# Patient Record
Sex: Female | Born: 1955 | Race: White | Hispanic: No | Marital: Married | Smoking: Never smoker
Health system: Southern US, Community
[De-identification: ages and names within clinical notes are randomized; demographics above are authoritative.]

## PROBLEM LIST (undated history)

## (undated) DIAGNOSIS — K219 Gastro-esophageal reflux disease without esophagitis: Secondary | ICD-10-CM

## (undated) HISTORY — PX: BREAST LUMPECTOMY: SHX2

## (undated) HISTORY — PX: OTHER SURGICAL HISTORY: SHX169

---

## 2021-10-12 ENCOUNTER — Emergency Department (HOSPITAL_COMMUNITY)
Admission: EM | Admit: 2021-10-12 | Discharge: 2021-10-12 | Disposition: A | Discharge status: Home / Self Care | Payer: No Typology Code available for payment source | Attending: Emergency Medicine | Admitting: Emergency Medicine

## 2021-10-12 ENCOUNTER — Encounter (HOSPITAL_COMMUNITY): Payer: Self-pay | Admitting: *Deleted

## 2021-10-12 DIAGNOSIS — W268XXA Contact with other sharp object(s), not elsewhere classified, initial encounter: Secondary | ICD-10-CM | POA: Insufficient documentation

## 2021-10-12 DIAGNOSIS — S60410A Abrasion of right index finger, initial encounter: Secondary | ICD-10-CM | POA: Diagnosis present

## 2021-10-12 DIAGNOSIS — Z7721 Contact with and (suspected) exposure to potentially hazardous body fluids: Secondary | ICD-10-CM | POA: Insufficient documentation

## 2021-10-12 HISTORY — DX: Gastro-esophageal reflux disease without esophagitis: K21.9

## 2021-10-12 LAB — RAPID HIV SCREEN (HIV 1/2 AB+AG)
HIV 1/2 Antibodies: NONREACTIVE
HIV-1 P24 Antigen - HIV24: NONREACTIVE

## 2021-10-12 MED ORDER — ELVITEG-COBIC-EMTRICIT-TENOFAF 150-150-200-10 MG PREPACK
1.0000 | ORAL_TABLET | Freq: Once | ORAL | Status: AC
  Administered 2021-10-12: 1 via ORAL
  Filled 2021-10-12: qty 1

## 2021-10-12 MED ORDER — ELVITEG-COBIC-EMTRICIT-TENOFAF 150-150-200-10 MG PO TABS: 1.0000 | ORAL_TABLET | Freq: Every day | ORAL | 0 refills | Status: AC

## 2021-10-12 MED ORDER — ONDANSETRON 4 MG PO TBDP: 4.0000 mg | ORAL_TABLET | Freq: Three times a day (TID) | ORAL | 0 refills | Status: AC | PRN

## 2021-10-12 NOTE — ED Provider Notes (Addendum)
[facility] EMERGENCY DEPARTMENT Provider Note   CSN: [id number] Arrival date & time: 10/12/21  1552     History Chief Complaint  Patient presents with   Body Fluid Exposure    Krystal Johnson is a 65 y.o. female.  The history is provided by the patient.  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  Pt reports she had taken her gloves off and source had bleed on an area she was working in.  Pt was sent here for evaltuion. She has had hepatitis vaccines.  She does want HIV medication. Pt does not know sources hiv or hepatitis status.  Source is in custody at jail     Past Medical History:  Diagnosis Date   GERD (gastroesophageal reflux disease)     There are no problems to display for this patient.      OB History   No obstetric history on file.     No family history on file.     Home Medications Prior to Admission medications   Medication Sig Start Date End Date Taking? Authorizing Provider  elvitegravir-cobicistat-emtricitabine-tenofovir (GENVOYA) 150-150-200-10 MG TABS tablet Take 1 tablet by mouth daily with breakfast. 10/12/21  Yes Cheron Schaumann K, PA-C  ondansetron (ZOFRAN-ODT) 4 MG disintegrating tablet Take 1 tablet (4 mg total) by mouth every 8 (eight) hours as needed for nausea or vomiting. 10/12/21  Yes [name], PA-C    Allergies    Morphine and related and Rabies vaccines  Review of Systems   Review of Systems  Skin:  Positive for wound.  All other systems reviewed and are negative.  Physical Exam Updated Vital Signs BP 119/63 (BP Location: Right Arm)   Pulse 80   Temp 98.2 F (36.8 C) (Oral)   Resp 15   Ht 5\' 4"  (1.626 m)   Wt 95.3 kg   SpO2 98%   BMI 36.05 kg/m   Physical Exam Vitals reviewed.  Constitutional:      Appearance:  Normal appearance.  HENT:     Head: Normocephalic.     Mouth/Throat:     Mouth: Mucous membranes are moist.  Cardiovascular:     Rate and Rhythm: Normal rate.  Pulmonary:     Effort: Pulmonary effort is normal.  Musculoskeletal:        General: No swelling.     Comments: Superficial abrasion right index finger,   Skin:    General: Skin is warm.  Neurological:     General: No focal deficit present.     Mental Status: She is alert.  Psychiatric:        Mood and Affect: Mood normal.    ED Results / Procedures / Treatments   Labs (all labs ordered are listed, but only abnormal results are displayed) Labs Reviewed  RAPID HIV SCREEN (HIV 1/2 AB+AG)  HEPATITIS PANEL, ACUTE    EKG None  Radiology No results found.  Procedures Procedures   Medications Ordered in ED Medications  elvitegravir-cobicistat-emtricitabine-tenofovir (GENVOYA) 150-150-200-10 Prepack 1 each (has no administration in time range)    ED Course  I have reviewed the triage vital signs and the nursing notes.  Pertinent labs & imaging results that were available during my care of the patient were reviewed by me and considered in my  medical decision making (see chart for details).    MDM Rules/Calculators/A&P                           MDM:  Pt cleaned area well.  Hepatitis and HIV baseline ordered.  Pt given prep drugs.  Pt advised source of blood needs testing as well  Final Clinical Impression(s) / ED Diagnoses Final diagnoses:  Exposure to blood or body fluid    Rx / DC Orders ED Discharge Orders          Ordered    elvitegravir-cobicistat-emtricitabine-tenofovir (GENVOYA) 150-150-200-10 MG TABS tablet  Daily with breakfast        10/12/21 1834    ondansetron (ZOFRAN-ODT) 4 MG disintegrating tablet  Every 8 hours PRN        10/12/21 1835             [name], PA-C 10/12/21 1852    Osie Cheeks 10/12/21 1852    Terrilee Files, MD 10/13/21 1106

## 2021-10-12 NOTE — Discharge Instructions (Signed)
The person who's blood you were exposed to needs to have Hiv and hepatitis testing.

## 2021-10-12 NOTE — ED Triage Notes (Signed)
Blood borne exposure at work yesterday

## 2021-10-12 NOTE — ED Notes (Signed)
Dc instructions and scripts reviewed with pt. No questions or concerns at this time.  

## 2021-10-13 LAB — HEPATITIS PANEL, ACUTE
HCV Ab: NONREACTIVE
Hep A IgM: NONREACTIVE
Hep B C IgM: NONREACTIVE
Hepatitis B Surface Ag: NONREACTIVE

## 2021-12-21 ENCOUNTER — Emergency Department (HOSPITAL_COMMUNITY): Payer: No Typology Code available for payment source

## 2021-12-21 ENCOUNTER — Emergency Department (HOSPITAL_COMMUNITY)
Admission: EM | Admit: 2021-12-21 | Discharge: 2021-12-21 | Disposition: A | Discharge status: Home / Self Care | Payer: No Typology Code available for payment source | Attending: Emergency Medicine | Admitting: Emergency Medicine

## 2021-12-21 ENCOUNTER — Other Ambulatory Visit: Payer: Self-pay

## 2021-12-21 ENCOUNTER — Encounter (HOSPITAL_COMMUNITY): Payer: Self-pay | Admitting: *Deleted

## 2021-12-21 DIAGNOSIS — S299XXA Unspecified injury of thorax, initial encounter: Secondary | ICD-10-CM | POA: Diagnosis present

## 2021-12-21 DIAGNOSIS — Y9301 Activity, walking, marching and hiking: Secondary | ICD-10-CM | POA: Diagnosis not present

## 2021-12-21 DIAGNOSIS — W010XXA Fall on same level from slipping, tripping and stumbling without subsequent striking against object, initial encounter: Secondary | ICD-10-CM | POA: Insufficient documentation

## 2021-12-21 DIAGNOSIS — S2232XA Fracture of one rib, left side, initial encounter for closed fracture: Secondary | ICD-10-CM

## 2021-12-21 MED ORDER — HYDROCODONE-ACETAMINOPHEN 5-325 MG PO TABS
1.0000 | ORAL_TABLET | Freq: Once | ORAL | Status: AC
  Administered 2021-12-21: 1 via ORAL
  Filled 2021-12-21: qty 1

## 2021-12-21 MED ORDER — HYDROCODONE-ACETAMINOPHEN 5-325 MG PO TABS: 1.0000 | ORAL_TABLET | Freq: Four times a day (QID) | ORAL | 0 refills | Status: DC | PRN

## 2021-12-21 MED ORDER — ONDANSETRON HCL 4 MG PO TABS: 4.0000 mg | ORAL_TABLET | Freq: Three times a day (TID) | ORAL | 0 refills | Status: AC | PRN

## 2021-12-21 MED ORDER — ONDANSETRON 4 MG PO TBDP
4.0000 mg | ORAL_TABLET | Freq: Once | ORAL | Status: AC
Start: 2021-12-21 — End: 2021-12-21
  Administered 2021-12-21: 4 mg via ORAL
  Filled 2021-12-21: qty 1

## 2021-12-21 NOTE — Discharge Instructions (Signed)
You may take the medications prescribed for pain relief.  Do not drive within 4 hours of taking hydrocodone as this medication will make you drowsy.  Use the incentive spirometer as discussed to help prevent pneumonia which is a possible complication from rib fractures.  Activity as tolerated.

## 2021-12-21 NOTE — ED Provider Notes (Signed)
[facility] EMERGENCY DEPARTMENT Provider Note   CSN: [id number] Arrival date & time: 12/21/21  1545     History  Chief Complaint  Patient presents with   Marletta Lor    Krystal Johnson is a 66 y.o. female ending for evaluation of injury sustained in a fall.  She was walking into her place of employment this morning when she tripped on the edge of a sidewalk and fell landing directly with her mid chest region against the edge of the sidewalk.  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  She reports localized pain at her sternum which radiates into the left lower lateral rib cage area.  She completed her workday but has had significant discomfort since this fall.  She denies hitting her head or her face.  And denies any other complaints of pain.  She denies shortness of breath but does state taking a deep breath worsens her localized pain.  She has had no medications for her symptoms prior to arrival.  The history is provided by the patient.      Home Medications Prior to Admission medications   Medication Sig Start Date End Date Taking? Authorizing Provider  HYDROcodone-acetaminophen (NORCO/VICODIN) 5-325 MG tablet Take 1 tablet by mouth every 6 (six) hours as needed. 12/21/21  Yes Melodee Lupe, Raynelle Fanning, PA-C  ondansetron (ZOFRAN) 4 MG tablet Take 1 tablet (4 mg total) by mouth every 8 (eight) hours as needed for nausea or vomiting. 12/21/21  Yes Corrine Tillis, Raynelle Fanning, PA-C  elvitegravir-cobicistat-emtricitabine-tenofovir (GENVOYA) 150-150-200-10 MG TABS tablet Take 1 tablet by mouth daily with breakfast. 10/12/21   [name], PA-C  ondansetron (ZOFRAN-ODT) 4 MG disintegrating tablet Take 1 tablet (4 mg total) by mouth every 8 (eight) hours as needed for nausea or vomiting. 10/12/21   [name], PA-C      Allergies    Morphine and related and Rabies vaccines    Review of Systems   Review of Systems  Constitutional:  Negative for fever.  HENT:  Negative for congestion.   Eyes:  Negative.   Respiratory:  Negative for chest tightness and shortness of breath.   Cardiovascular:  Positive for chest pain.  Gastrointestinal:  Negative for abdominal pain, nausea and vomiting.  Genitourinary: Negative.   Musculoskeletal:  Negative for arthralgias, back pain, joint swelling and neck pain.  Skin: Negative.  Negative for rash and wound.  Neurological:  Negative for dizziness, weakness, light-headedness, numbness and headaches.  Psychiatric/Behavioral: Negative.    All other systems reviewed and are negative.  Physical Exam Updated Vital Signs BP (!) 123/56 (BP Location: Right Arm)    Pulse 69    Temp 98.2 F (36.8 C) (Oral)    Resp 16    Ht 5\' 4"  (1.626 m)    Wt 95.3 kg    SpO2 100%    BMI 36.05 kg/m  Physical Exam Vitals and nursing note reviewed.  Constitutional:      Appearance: She is well-developed.  HENT:     Head: Normocephalic and atraumatic.  Eyes:     Conjunctiva/sclera: Conjunctivae normal.  Cardiovascular:     Rate and Rhythm: Normal rate and regular rhythm.     Heart sounds: Normal heart sounds.  Pulmonary:     Effort: Pulmonary effort is normal.     Breath sounds: Normal breath sounds. No wheezing.  Chest:     Chest wall: Tenderness present. No deformity or swelling.       Comments: Tender to palpation  lower sternal border, not tender at her xiphoid.  Also tenderness at her left lateral ribs at her mid axillary line.  There is no palpable deformity, no crepitus. Abdominal:     General: Bowel sounds are normal.     Palpations: Abdomen is soft.     Tenderness: There is no abdominal tenderness.  Musculoskeletal:        General: Normal range of motion.     Cervical back: Normal and normal range of motion.     Thoracic back: Normal.     Lumbar back: Normal.  Skin:    General: Skin is warm and dry.  Neurological:     Mental Status: She is alert.    ED Results / Procedures / Treatments   Labs (all labs ordered are listed, but only abnormal  results are displayed) Labs Reviewed - No data to display  EKG None  Radiology DG Ribs Unilateral W/Chest Left  Result Date: 12/21/2021 CLINICAL DATA:  Status post fall.  Left rib pain. EXAM: LEFT RIBS AND CHEST - 3+ VIEW COMPARISON:  None. FINDINGS: Cardiac silhouette and mediastinal contours are within normal limits. Lungs are clear. No pleural effusion or pneumothorax. Mild dextrocurvature of the thoracolumbar junction. On frontal view of the left ribs, there appears to be minimal cortical step-off and linear lucency indicating a nondisplaced acute fracture of the lateral aspect of the left eleventh rib. IMPRESSION: Acute nondisplaced fracture of the lateral aspect of the left eleventh rib. No pneumothorax. Electronically Signed   By: Neita Garnet M.D.   On: 12/21/2021 16:57   DG Sternum  Result Date: 12/21/2021 CLINICAL DATA:  Fall.  Sternal chest pain. EXAM: STERNUM - 1 VIEW COMPARISON:  None. FINDINGS: Lateral view of the sternum shows no evidence of fracture or other focal bone lesions. IMPRESSION: Negative. Electronically Signed   By: Danae Orleans M.D.   On: 12/21/2021 18:56    Procedures Procedures    Medications Ordered in ED Medications  HYDROcodone-acetaminophen (NORCO/VICODIN) 5-325 MG per tablet 1 tablet (1 tablet Oral Given 12/21/21 1842)  ondansetron (ZOFRAN-ODT) disintegrating tablet 4 mg (4 mg Oral Given 12/21/21 1843)    ED Course/ Medical Decision Making/ A&P                           Medical Decision Making Patient with left rib fracture after direct blow from a fall which occurred earlier this morning.  No other complaints of injury or pain, she does have some tenderness in her lower sternum, this imaging is negative for fracture.  Amount and/or Complexity of Data Reviewed Radiology: ordered and independent interpretation performed.    Details: Left 11th rib fracture, nondisplaced.  Risk OTC drugs. Prescription drug management. Risk Details: Patient with a  solitary nondisplaced rib fracture.  Discussed home treatment including activity as tolerated, she was prescribed hydrocodone, she does endorse narcotics make her nauseated, she was given Zofran in addition to this medication.  She is also given incentive spirometer and instructed in its use.  Advised close follow-up with her PCP or return here for any new or worsening symptoms.  As this was a work-related injury she will require follow-up care with occupational medicine.  She has been in contact with her employer regarding this injury who should arrange follow-up care.           Final Clinical Impression(s) / ED Diagnoses Final diagnoses:  Closed fracture of one rib of left side, initial encounter  Rx / DC Orders ED Discharge Orders          Ordered    HYDROcodone-acetaminophen (NORCO/VICODIN) 5-325 MG tablet  Every 6 hours PRN        12/21/21 1902    ondansetron (ZOFRAN) 4 MG tablet  Every 8 hours PRN        12/21/21 1902              Victoriano Lain 12/21/21 2004    Zammit, Joseph, MD 12/22/21 1818

## 2021-12-21 NOTE — ED Triage Notes (Signed)
Pt tripped on lip of a sidewalk this morning, c/o left rib pain.  Denies hitting her head.

## 2021-12-22 ENCOUNTER — Telehealth (HOSPITAL_COMMUNITY): Payer: Self-pay | Admitting: Emergency Medicine

## 2021-12-22 MED ORDER — HYDROCODONE-ACETAMINOPHEN 7.5-300 MG PO TABS: 0.5000 | ORAL_TABLET | Freq: Four times a day (QID) | ORAL | 0 refills | Status: AC | PRN

## 2021-12-22 NOTE — Telephone Encounter (Signed)
Call from pharmacist to advise that there facility does not have hydrocodone 5 mg tablets.  I have sent a new prescription for 7.5 mg strength with instructions to take 1/2 tablet every 6 hours as needed pain.  #10 with 0 refills.

## 2022-08-12 ENCOUNTER — Encounter (HOSPITAL_COMMUNITY): Payer: Self-pay | Admitting: *Deleted

## 2022-08-12 ENCOUNTER — Other Ambulatory Visit: Payer: Self-pay

## 2022-08-12 ENCOUNTER — Emergency Department (HOSPITAL_COMMUNITY): Payer: BC Managed Care – PPO

## 2022-08-12 ENCOUNTER — Emergency Department (HOSPITAL_COMMUNITY)
Admission: EM | Admit: 2022-08-12 | Discharge: 2022-08-12 | Disposition: A | Discharge status: Home / Self Care | Payer: BC Managed Care – PPO | Attending: Emergency Medicine | Admitting: Emergency Medicine

## 2022-08-12 DIAGNOSIS — Z21 Asymptomatic human immunodeficiency virus [HIV] infection status: Secondary | ICD-10-CM | POA: Diagnosis not present

## 2022-08-12 DIAGNOSIS — R1013 Epigastric pain: Secondary | ICD-10-CM

## 2022-08-12 DIAGNOSIS — R109 Unspecified abdominal pain: Secondary | ICD-10-CM | POA: Diagnosis present

## 2022-08-12 LAB — COMPREHENSIVE METABOLIC PANEL
ALT: 12 U/L (ref 0–44)
AST: 17 U/L (ref 15–41)
Albumin: 4 g/dL (ref 3.5–5.0)
Alkaline Phosphatase: 51 U/L (ref 38–126)
Anion gap: 8 (ref 5–15)
BUN: 14 mg/dL (ref 8–23)
CO2: 26 mmol/L (ref 22–32)
Calcium: 9 mg/dL (ref 8.9–10.3)
Chloride: 105 mmol/L (ref 98–111)
Creatinine, Ser: 0.8 mg/dL (ref 0.44–1.00)
GFR, Estimated: >60 mL/min (ref >60)
Glucose, Bld: 96 mg/dL (ref 70–99)
Potassium: 4.4 mmol/L (ref 3.5–5.1)
Sodium: 139 mmol/L (ref 135–145)
Total Bilirubin: 0.7 mg/dL (ref 0.3–1.2)
Total Protein: 7.1 g/dL (ref 6.5–8.1)

## 2022-08-12 LAB — CBC
HCT: 38.9 % (ref 36.0–46.0)
Hemoglobin: 13 g/dL (ref 12.0–15.0)
MCH: 29.4 pg (ref 26.0–34.0)
MCHC: 33.4 g/dL (ref 30.0–36.0)
MCV: 88 fL (ref 80.0–100.0)
Platelets: 288 10*3/uL (ref 150–400)
RBC: 4.42 MIL/uL (ref 3.87–5.11)
RDW: 13.1 % (ref 11.5–15.5)
WBC: 7 10*3/uL (ref 4.0–10.5)
nRBC: 0 % (ref 0.0–0.2)

## 2022-08-12 LAB — URINALYSIS, ROUTINE W REFLEX MICROSCOPIC
Bacteria, UA: NONE SEEN
Bilirubin Urine: NEGATIVE
Glucose, UA: NEGATIVE mg/dL
Hgb urine dipstick: NEGATIVE
Ketones, ur: 5 mg/dL — AB
Nitrite: NEGATIVE
Protein, ur: 30 mg/dL — AB
Specific Gravity, Urine: 1.025 (ref 1.005–1.030)
pH: 5 (ref 5.0–8.0)

## 2022-08-12 LAB — LIPASE, BLOOD: Lipase: 37 U/L (ref 11–51)

## 2022-08-12 MED ORDER — SODIUM CHLORIDE 0.9 % IV BOLUS
500.0000 mL | Freq: Once | INTRAVENOUS | Status: AC
  Administered 2022-08-12: 500 mL via INTRAVENOUS

## 2022-08-12 MED ORDER — KETOROLAC TROMETHAMINE 30 MG/ML IJ SOLN
15.0000 mg | Freq: Once | INTRAMUSCULAR | Status: AC
  Administered 2022-08-12: 15 mg via INTRAVENOUS
  Filled 2022-08-12: qty 1

## 2022-08-12 MED ORDER — LIDOCAINE VISCOUS HCL 2 % MT SOLN
15.0000 mL | Freq: Once | OROMUCOSAL | Status: AC
  Administered 2022-08-12: 15 mL via ORAL
  Filled 2022-08-12: qty 15

## 2022-08-12 MED ORDER — ONDANSETRON HCL 4 MG/2ML IJ SOLN
4.0000 mg | Freq: Once | INTRAMUSCULAR | Status: AC
  Administered 2022-08-12: 4 mg via INTRAVENOUS
  Filled 2022-08-12: qty 2

## 2022-08-12 MED ORDER — IOHEXOL 300 MG/ML  SOLN
100.0000 mL | Freq: Once | INTRAMUSCULAR | Status: AC | PRN
  Administered 2022-08-12: 100 mL via INTRAVENOUS

## 2022-08-12 MED ORDER — FAMOTIDINE 20 MG PO TABS: 20.0000 mg | ORAL_TABLET | Freq: Two times a day (BID) | ORAL | 0 refills | Status: AC

## 2022-08-12 MED ORDER — FAMOTIDINE IN NACL 20-0.9 MG/50ML-% IV SOLN
20.0000 mg | Freq: Once | INTRAVENOUS | Status: AC
  Administered 2022-08-12: 20 mg via INTRAVENOUS
  Filled 2022-08-12: qty 50

## 2022-08-12 MED ORDER — ALUM & MAG HYDROXIDE-SIMETH 200-200-20 MG/5ML PO SUSP
30.0000 mL | Freq: Once | ORAL | Status: AC
  Administered 2022-08-12: 30 mL via ORAL
  Filled 2022-08-12: qty 30

## 2022-08-12 NOTE — ED Notes (Signed)
Pt alert, NAD, calm, interactive. C/o RLQ abd pain, onset 1 week ago, mentions nausea and constipation. Denies fever, diarrhea or vomiting. Pt to CT at this time.

## 2022-08-12 NOTE — Discharge Instructions (Signed)
Note the work-up today was overall reassuring.  I suspect cause of your pain being irritation of the lining of your stomach.  Recommend daily famotidine as well as Maalox as needed.  Famotidine is a prescription which she will take daily and meloxicam find over-the-counter.  Avoid NSAIDs such as Aleve and ibuprofen.  Close follow-up with PCP recommended in 2 to 3 days for reevaluation.  Please do not hesitate to return to emergency department for worrisome signs and symptoms we discussed become apparent.

## 2022-08-12 NOTE — ED Triage Notes (Signed)
Pt with abd pain with N/V over a week ago.  Seen PCP on Thursday, checked for UTI. Pt is being treated for UTI and instructed to come if she does not feel better or if it gets worse. LBM today, hard stool per pt.

## 2022-08-12 NOTE — ED Provider Notes (Signed)
[facility] EMERGENCY DEPARTMENT Provider Note   CSN: [id number] Arrival date & time: 08/12/22  1454     History  Chief Complaint  Patient presents with   Abdominal Pain    Krystal Johnson is a 66 y.o. female.   Abdominal Pain   66 year old female presents emergency department with complaints of abdominal pain.  Patient states that symptoms of abdominal pain is been present for approximately the past week.  She states the symptoms began insidiously.  Pain is worsened with consumption of food as well as with physical movement.  She reports 2-3 episodes of nonbloody emesis over the past week.  She was seen by her PCP this past Thursday and was prescribed Cipro as well as Phenergan for presumed UTI.  She presents emergency department for reevaluation.  She secondarily endorses some feelings of nausea intermittently as well as decrease in bowel movements.  Last bowel movement was this morning but was reported to be hard and firm in consistency.  Denies hematochezia or melena.  Denies fever, chills, night sweats, chest pain, shortness of breath, urinary/vaginal symptoms.  Past medical history significant for GERD, HIV of which patient reported viral load undetectable and compliance with medicines Prior abdominal surgeries include 5 C-sections, bilateral tubal ligation with secondary removal of fallopian tubes.  Home Medications Prior to Admission medications   Medication Sig Start Date End Date Taking? Authorizing Provider  elvitegravir-cobicistat-emtricitabine-tenofovir (GENVOYA) 150-150-200-10 MG TABS tablet Take 1 tablet by mouth daily with breakfast. 10/12/21   [name], PA-C  HYDROcodone-Acetaminophen 7.5-300 MG TABS Take 0.5 tablets by mouth every 6 (six) hours as needed. 12/22/21   Idol, Raynelle Fanning, PA-C  ondansetron (ZOFRAN) 4 MG tablet Take 1 tablet (4 mg total) by mouth every 8 (eight) hours as needed for nausea or vomiting. 12/21/21   Burgess Amor, PA-C  ondansetron (ZOFRAN-ODT)  4 MG disintegrating tablet Take 1 tablet (4 mg total) by mouth every 8 (eight) hours as needed for nausea or vomiting. 10/12/21   [name], PA-C      Allergies    Morphine and related and Rabies vaccines    Review of Systems   Review of Systems  Gastrointestinal:  Positive for abdominal pain.  All other systems reviewed and are negative.   Physical Exam Updated Vital Signs BP 115/60   Pulse 66   Temp 97.9 F (36.6 C) (Oral)   Resp 14   Ht 5\' 4"  (1.626 m)   Wt 82.6 kg   SpO2 100%   BMI 31.24 kg/m  Physical Exam Vitals and nursing note reviewed.  Constitutional:      General: She is not in acute distress.    Appearance: She is well-developed.  HENT:     Head: Normocephalic and atraumatic.  Eyes:     Conjunctiva/sclera: Conjunctivae normal.  Cardiovascular:     Rate and Rhythm: Normal rate and regular rhythm.     Heart sounds: No murmur heard. Pulmonary:     Effort: Pulmonary effort is normal. No respiratory distress.     Breath sounds: Normal breath sounds.  Abdominal:     Palpations: Abdomen is soft.     Tenderness: There is abdominal tenderness in the epigastric area.  Musculoskeletal:        General: No swelling.     Cervical back: Neck supple.  Skin:    General: Skin is warm and dry.     Capillary Refill: Capillary refill takes less than 2 seconds.  Neurological:     Mental  Status: She is alert.  Psychiatric:        Mood and Affect: Mood normal.     ED Results / Procedures / Treatments   Labs (all labs ordered are listed, but only abnormal results are displayed) Labs Reviewed  URINALYSIS, ROUTINE W REFLEX MICROSCOPIC - Abnormal; Notable for the following components:      Result Value   APPearance HAZY (*)    Ketones, ur 5 (*)    Protein, ur 30 (*)    Leukocytes,Ua SMALL (*)    All other components within normal limits  URINE CULTURE  LIPASE, BLOOD  COMPREHENSIVE METABOLIC PANEL  CBC    EKG None  Radiology CT Abdomen Pelvis W  Contrast  Result Date: 08/12/2022 CLINICAL DATA:  Abdominal pain with nausea and vomiting. EXAM: CT ABDOMEN AND PELVIS WITH CONTRAST TECHNIQUE: Multidetector CT imaging of the abdomen and pelvis was performed using the standard protocol following bolus administration of intravenous contrast. RADIATION DOSE REDUCTION: This exam was performed according to the departmental dose-optimization program which includes automated exposure control, adjustment of the mA and/or kV according to patient size and/or use of iterative reconstruction technique. CONTRAST:  OMNIPAQUE IOHEXOL 300 MG/ML  SOLN COMPARISON:  None Available. FINDINGS: Lower chest: No acute abnormality. Hepatobiliary: No focal liver abnormality is seen. No gallstones, gallbladder wall thickening, or biliary dilatation. Pancreas: Unremarkable. No pancreatic ductal dilatation or surrounding inflammatory changes. Spleen: Normal in size without focal abnormality. Adrenals/Urinary Tract: Adrenal glands are unremarkable. Kidneys are normal, without renal calculi, focal lesion, or hydronephrosis. Bladder is unremarkable. Stomach/Bowel: Stomach is within normal limits. Appendix appears normal. Stool is seen throughout the large bowel. No evidence of bowel wall thickening, distention, or inflammatory changes. Multiple surgical clips are seen within the lower left hemipelvis. Vascular/Lymphatic: No significant vascular findings are present. No enlarged abdominal or pelvic lymph nodes. Reproductive: Uterus and bilateral adnexa are unremarkable. Other: No abdominal wall hernia or abnormality. No abdominopelvic ascites. Musculoskeletal: Moderate severity degenerative changes are seen at the level of T12-L1. No acute osseous abnormalities identified. IMPRESSION: 1. No acute or active process within the abdomen or pelvis. 2. Moderate severity degenerative changes at the level of T12-L1. Electronically Signed   By: Aram Candela M.D.   On: 08/12/2022 19:12     Procedures Procedures    Medications Ordered in ED Medications  famotidine (PEPCID) IVPB 20 mg premix (has no administration in time range)  alum & mag hydroxide-simeth (MAALOX/MYLANTA) 200-200-20 MG/5ML suspension 30 mL (has no administration in time range)    And  lidocaine (XYLOCAINE) 2 % viscous mouth solution 15 mL (has no administration in time range)  ketorolac (TORADOL) 30 MG/ML injection 15 mg (15 mg Intravenous Given 08/12/22 1850)  sodium chloride 0.9 % bolus 500 mL (500 mLs Intravenous New Bag/Given 08/12/22 1850)  ondansetron (ZOFRAN) injection 4 mg (4 mg Intravenous Given 08/12/22 1850)  iohexol (OMNIPAQUE) 300 MG/ML solution 100 mL (100 mLs Intravenous Contrast Given 08/12/22 1852)    ED Course/ Medical Decision Making/ A&P                            Medical Decision Making Amount and/or Complexity of Data Reviewed Labs: ordered. Radiology: ordered.  Risk OTC drugs. Prescription drug management.   This patient presents to the ED for concern of abdominal pain, this involves an extensive number of treatment options, and is a complaint that carries with it a high risk of complications and morbidity.  The differential diagnosis includes AAA, mesenteric ischemia, appendicitis, diverticulitis, DKA, gastritis, gastroenteritis, AMI, nephrolithiasis, pancreatitis, peritonitis, constipation, UTI,SBO/LBO, , biliary disease, IBD, IBS, PUD, or hepatitis.   Co morbidities that complicate the patient evaluation  See HPI   Additional history obtained:  Additional history obtained from EMR External records from outside source obtained and reviewed including hospital records   Lab Tests:  I Ordered, and personally interpreted labs.  The pertinent results include: No leukocytosis noted.  No evidence anemia.  Platelets within normal range.  Lipase within normal range.  No electrolyte abnormality noted.  Renal function within normal limits.  No transaminitis noted.  UA  significant for small leukocyte, 11-20 WBC and no bacteria; Simaan concerning for urinary tract infection of which patient is receiving outpatient ciprofloxacin for 1 day; recommend continued therapy.  Urine culture sent.   Imaging Studies ordered:  I ordered imaging studies including CT abdomen pelvis I independently visualized and interpreted imaging which showed no acute or active process within the abdomen or pelvis.  Moderate severity and degenerative changes at the level of T12-L1. I agree with the radiologist interpretation  Cardiac Monitoring: / EKG:  The patient was maintained on a cardiac monitor.  I personally viewed and interpreted the cardiac monitored which showed an underlying rhythm of: Sinus rhythm   Consultations Obtained:  N/a   Problem List / ED Course / Critical interventions / Medication management  Abdominal pain I ordered medication including Toradol for pain, Zofran for nausea and 500 mL of normal saline for rehydration.  Maalox and Pepcid for GI cocktail.   Reevaluation of the patient after these medicines showed that the patient improved I have reviewed the patients home medicines and have made adjustments as needed   Social Determinants of Health:  Denies tobacco, illicit drug use.   Test / Admission - Considered:  Abdominal pain Vitals signs within normal range and stable throughout visit. Laboratory/imaging studies significant for: See above Patient's symptoms could be secondary to gastritis/PUD given area of tenderness and overall negative work-up.  Patient responded well to GI cocktail administered while in the emergency department.  Continue outpatient therapy recommended at the same with famotidine daily as well as Maalox as needed.  Patient recommended to avoid NSAIDs for the time being with close follow-up with PCP outpatient in 2 to 3 days.  Treatment plan discussed along with patient she acknowledged understand was agreeable to said  plan. Worrisome signs and symptoms were discussed with the patient, and the patient acknowledged understanding to return to the ED if noticed. Patient was stable upon discharge.          Final Clinical Impression(s) / ED Diagnoses Final diagnoses:  Abdominal pain, epigastric    Rx / DC Orders ED Discharge Orders     None         Wilnette Kales, [place] 08/12/22 1932    Audley Hose, MD 08/13/22 [id number]

## 2022-08-14 LAB — URINE CULTURE: Culture: NO GROWTH

## 2023-09-20 IMAGING — DX DG RIBS W/ CHEST 3+V*L*
4 series · 4 of 4 positions shown · non-contrast
Comparison: None.

CLINICAL DATA: Status post fall.  Left rib pain.

EXAM:
LEFT RIBS AND CHEST - 3+ VIEW

[chest pa]
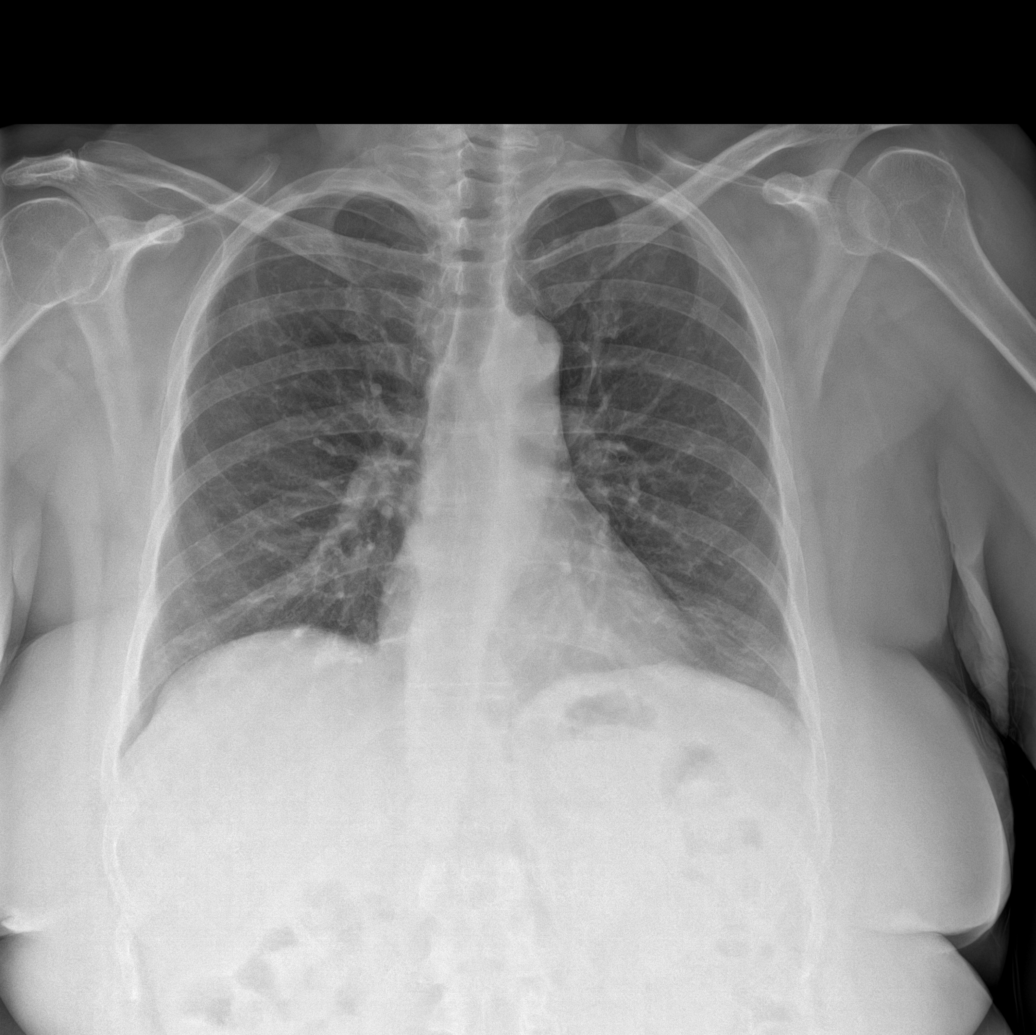

[rib pa]
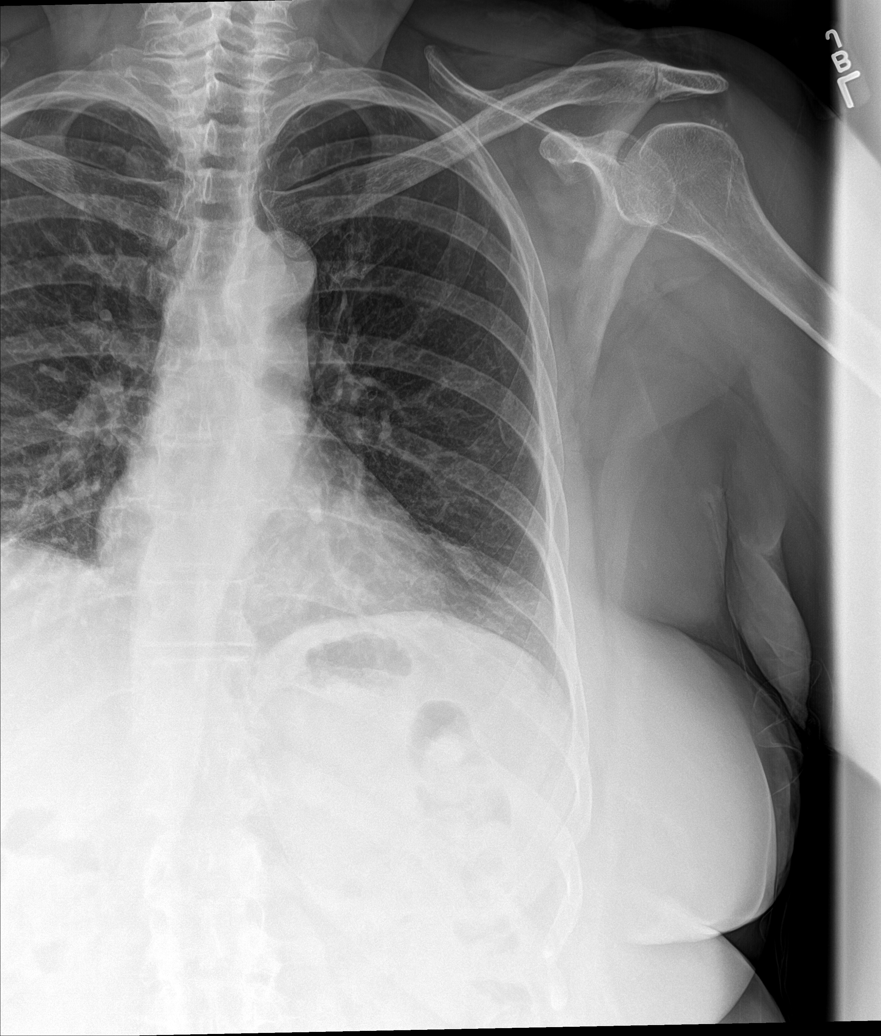

[rib obl (1 of 2)]
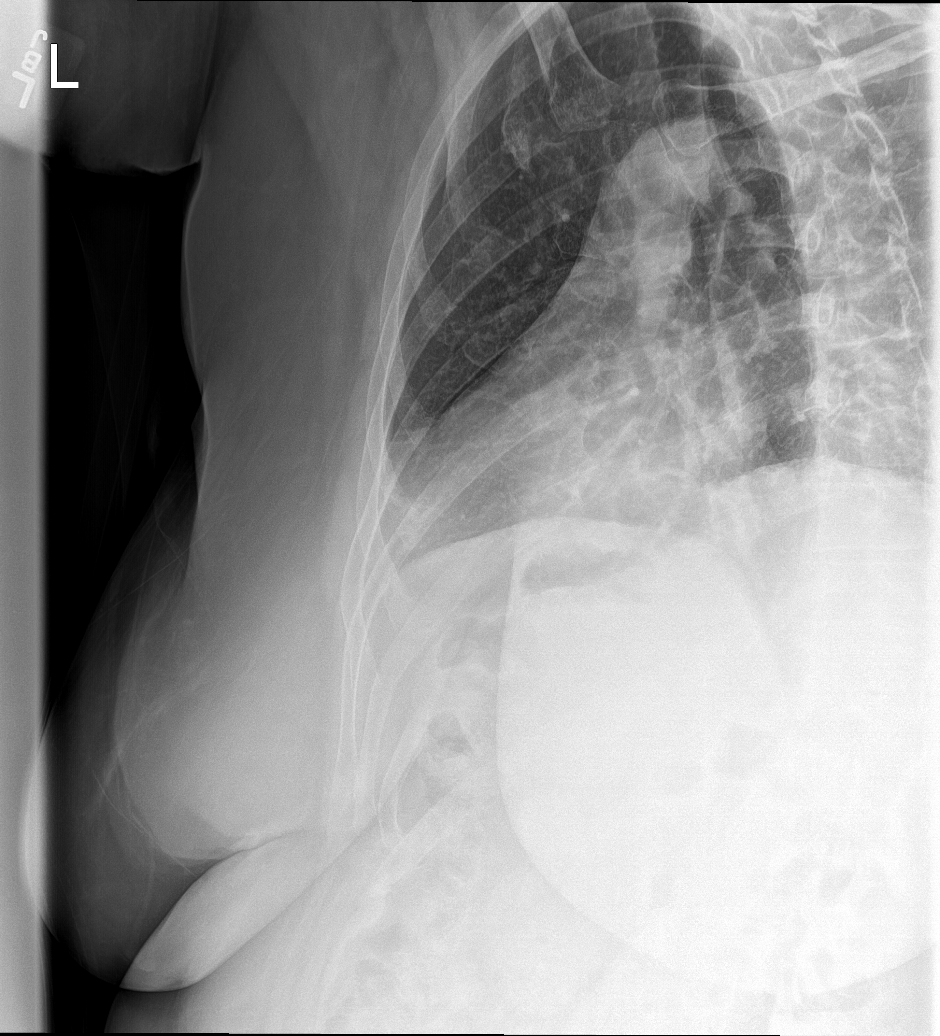

[rib obl (2 of 2)]
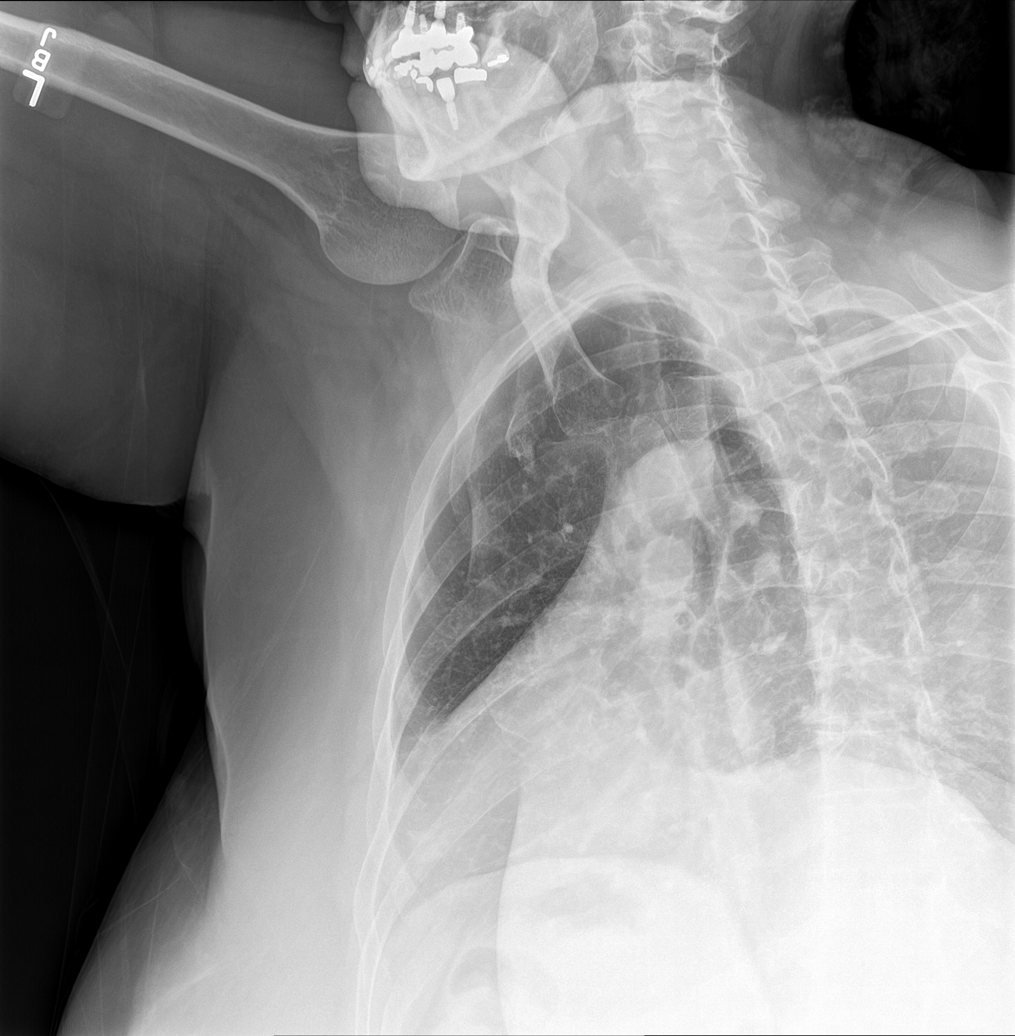

[4 of 4 positions shown; findings below may reference images not displayed]

FINDINGS: Cardiac silhouette and mediastinal contours are within normal
limits. Lungs are clear. No pleural effusion or pneumothorax. Mild
dextrocurvature of the thoracolumbar junction.

On frontal view of the left ribs, there appears to be minimal
cortical step-off and linear lucency indicating a nondisplaced acute
fracture of the lateral aspect of the left eleventh rib.
IMPRESSION: Acute nondisplaced fracture of the lateral aspect of the left
eleventh rib. No pneumothorax.

## 2023-09-20 IMAGING — DX DG STERNUM 2+V
1 series · 1 of 1 positions shown · non-contrast
Comparison: None.

CLINICAL DATA: Fall.  Sternal chest pain.

EXAM:
STERNUM - 1 VIEW

[sternum lat]
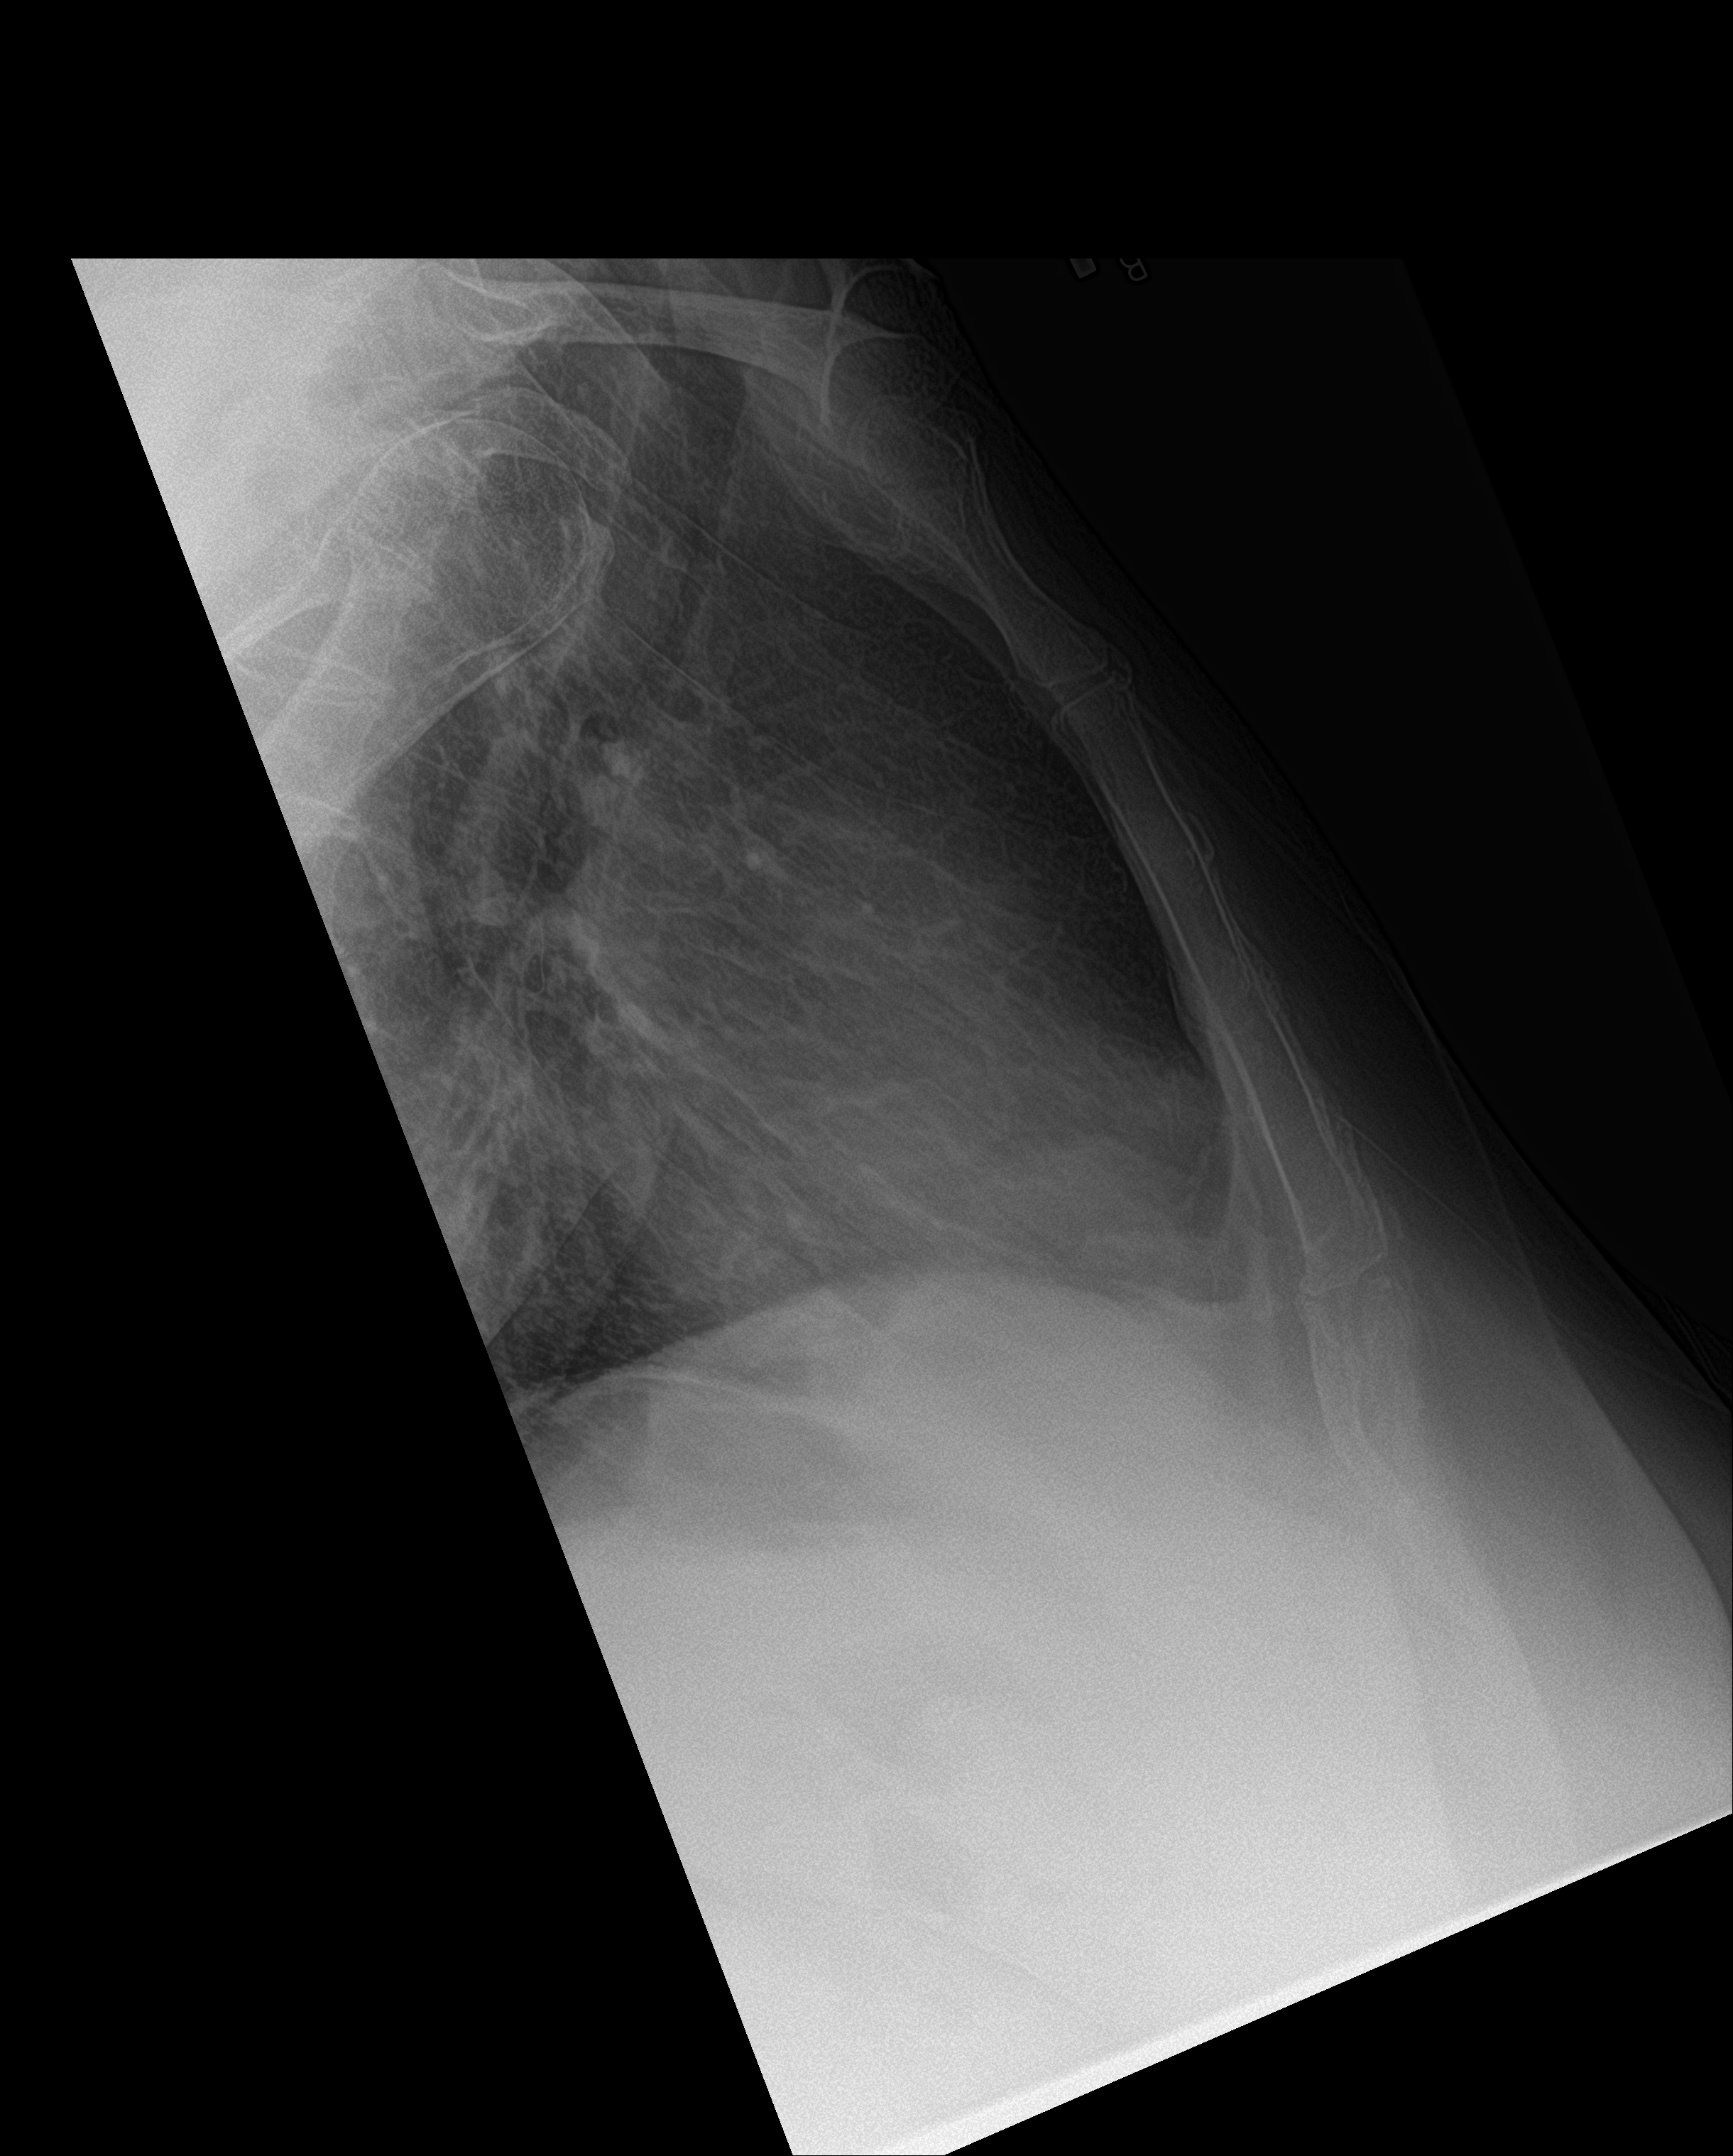

[1 of 1 positions shown; findings below may reference images not displayed]

FINDINGS: Lateral view of the sternum shows no evidence of fracture or other
focal bone lesions.
IMPRESSION: Negative.

## 2024-10-07 ENCOUNTER — Emergency Department (HOSPITAL_COMMUNITY)

## 2024-10-07 ENCOUNTER — Emergency Department (HOSPITAL_COMMUNITY): Admission: EM | Admit: 2024-10-07 | Discharge: 2024-10-07 | Disposition: A | Discharge status: Home / Self Care

## 2024-10-07 ENCOUNTER — Encounter (HOSPITAL_COMMUNITY): Payer: Self-pay

## 2024-10-07 ENCOUNTER — Other Ambulatory Visit: Payer: Self-pay

## 2024-10-07 DIAGNOSIS — W010XXA Fall on same level from slipping, tripping and stumbling without subsequent striking against object, initial encounter: Secondary | ICD-10-CM | POA: Insufficient documentation

## 2024-10-07 DIAGNOSIS — W19XXXA Unspecified fall, initial encounter: Secondary | ICD-10-CM

## 2024-10-07 DIAGNOSIS — Y990 Civilian activity done for income or pay: Secondary | ICD-10-CM | POA: Diagnosis not present

## 2024-10-07 DIAGNOSIS — S63501A Unspecified sprain of right wrist, initial encounter: Secondary | ICD-10-CM | POA: Diagnosis not present

## 2024-10-07 DIAGNOSIS — M25561 Pain in right knee: Secondary | ICD-10-CM | POA: Diagnosis not present

## 2024-10-07 DIAGNOSIS — M25531 Pain in right wrist: Secondary | ICD-10-CM | POA: Diagnosis present

## 2024-10-07 MED ORDER — IBUPROFEN 400 MG PO TABS
600.0000 mg | ORAL_TABLET | Freq: Once | ORAL | Status: AC
  Administered 2024-10-07: 600 mg via ORAL
  Filled 2024-10-07: qty 2

## 2024-10-07 NOTE — ED Provider Notes (Signed)
 [place] EMERGENCY DEPARTMENT AT [facility] Provider Note   CSN: [id number] Arrival date & time: 10/07/24  1346     Patient presents with: Krystal Johnson is a 68 y.o. female.  Patient reports to ER with mechanical fall after tripping over a box at work.  Patient reports that she fell onto her right knee and onto her right outstretched arm.  When she fell she did not hit her head.  She currently has no head or neck pain.  She does report pain in her right shoulder near her distal clavicle, anterior right knee pain and right wrist pain.  She is able to ambulate with steady gait.  She has no numbness or tingling.    Fall       Prior to Admission medications   Medication Sig Start Date End Date Taking? Authorizing Provider  elvitegravir-cobicistat-emtricitabine-tenofovir (GENVOYA ) 150-150-200-10 MG TABS tablet Take 1 tablet by mouth daily with breakfast. 10/12/21   Sofia, Leslie K, PA-C  famotidine  (PEPCID ) 20 MG tablet Take 1 tablet (20 mg total) by mouth 2 (two) times daily. 08/12/22   Silver Wonda LABOR, PA  HYDROcodone -Acetaminophen  7.5-300 MG TABS Take 0.5 tablets by mouth every 6 (six) hours as needed. 12/22/21   Idol, Julie, PA-C  ondansetron  (ZOFRAN ) 4 MG tablet Take 1 tablet (4 mg total) by mouth every 8 (eight) hours as needed for nausea or vomiting. 12/21/21   Idol, Julie, PA-C  ondansetron  (ZOFRAN -ODT) 4 MG disintegrating tablet Take 1 tablet (4 mg total) by mouth every 8 (eight) hours as needed for nausea or vomiting. 10/12/21   Sofia, Leslie K, PA-C    Allergies: Morphine and codeine and Rabies vaccines    Review of Systems  Musculoskeletal:  Positive for arthralgias.    Updated Vital Signs BP 125/72   Pulse 67   Temp 98.1 F (36.7 C)   Resp 16   Ht 5' 4 (1.626 m)   Wt 86.2 kg   SpO2 98%   BMI 32.61 kg/m   Physical Exam Vitals and nursing note reviewed.  Constitutional:      General: She is not in acute distress.    Appearance: She is  not toxic-appearing.  HENT:     Head: Normocephalic and atraumatic.  Eyes:     General: No scleral icterus.    Conjunctiva/sclera: Conjunctivae normal.  Cardiovascular:     Rate and Rhythm: Normal rate and regular rhythm.     Pulses: Normal pulses.     Heart sounds: Normal heart sounds.  Pulmonary:     Effort: Pulmonary effort is normal. No respiratory distress.     Breath sounds: Normal breath sounds.  Abdominal:     General: Abdomen is flat. Bowel sounds are normal.     Palpations: Abdomen is soft.     Tenderness: There is no abdominal tenderness.  Musculoskeletal:     Comments: Mild tenderness to palpation over right wrist.  Strong radial pulse.  Neurovascularly intact distally. Tenderness over anterior knee, neurovascular intact distally.  No calf swelling.  Skin:    General: Skin is warm and dry.     Findings: No lesion.  Neurological:     General: No focal deficit present.     Mental Status: She is alert and oriented to person, place, and time. Mental status is at baseline.     (all labs ordered are listed, but only abnormal results are displayed) Labs Reviewed - No data to display  EKG: None  Radiology:  DG Hand Complete Right Result Date: 10/07/2024 CLINICAL DATA:  wrist pain EXAM: RIGHT HAND - COMPLETE 3+ VIEW COMPARISON:  None Available. FINDINGS: Normal variant lunotriquetral coalition noted.No acute fracture or dislocation. Well corticated ossific fragment along the ulnar styloid, consistent with a remote fracture. 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 There is no evidence of arthropathy or other focal bone abnormality. Soft tissues are unremarkable. IMPRESSION: No acute fracture or dislocation. Electronically Signed   By: Rogelia Myers M.D.   On: 10/07/2024 15:58   DG Clavicle Right Result Date: 10/07/2024 EXAM: 2 VIEW(S) XRAY OF THE CLAVICLE COMPLETE 10/07/2024 02:30:30 PM COMPARISON: None available. CLINICAL HISTORY: knee pain right clavicle pain  post fall FINDINGS: BONES: No acute fracture or focal osseous lesion. JOINTS: No joint dislocation. SOFT TISSUES: The soft tissues are unremarkable. IMPRESSION: 1. No acute osseous abnormality. Electronically signed by: Luke Bun MD 10/07/2024 03:44 PM EST RP Workstation: [id number]   DG Knee Complete 4 Views Right Result Date: 10/07/2024 EXAM: 4 OR MORE VIEW(S) XRAY OF THE _LATERALITY_ KNEE 10/07/2024 02:30:30 PM COMPARISON: None available. CLINICAL HISTORY: knee pain FINDINGS: BONES AND JOINTS: No acute fracture. No focal osseous lesion. No joint dislocation. No significant joint effusion. Moderateoint space narrowing with subcortical cystic change and osteophyte formation in the lateral compartment. Mild osteophyte formation in the patellofemoral compartment. SOFT TISSUES: The soft tissues are unremarkable. IMPRESSION: 1. No acute osseous abnormality. 2. 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 Electronically signed by: Luke Bun MD 10/07/2024 03:43 PM EST RP Workstation: [id number]     Procedures   Medications Ordered in the ED  ibuprofen  (ADVIL ) tablet 600 mg (has no administration in time range)                                    Medical Decision Making Amount and/or Complexity of Data Reviewed Radiology: ordered.  Risk Prescription drug management.   This patient presents to the ED for concern of fall, this involves an extensive number of treatment options, and is a complaint that carries with it a high risk of complications and morbidity.  The differential diagnosis includes cranial hemorrhage, cervical fracture, fracture, sprain syncope seizure   Imaging Studies ordered:  I ordered imaging studies including x-ray of right hand, right clavicle right knee I independently visualized and interpreted imaging which showed findings I agree with the radiologist interpretation   Cardiac Monitoring: / EKG:  The patient was maintained on a cardiac  monitor.     Problem List / ED Course / Critical interventions / Medication management  Reports with mechanical fall.  On arrival she is hemodynamically stable and well-appearing.  She denied her head.  No blood thinners.  No headache or neck pain.  She ambulates with steady gait.  Appears atraumatic.  Does have mild tenderness in her right wrist, and knee.  X-rays are overall reassuring. NV intact. Vitals stable.  I ordered medication including provided with wrist brace, given ibuprofen  Reevaluation of the patient after these medicines showed that the patient improved I have reviewed the patients home medicines and have made adjustments as needed Given overall reassuring workup feel stable for discharge with close outpatient follow-up.  Given return precautions.        Final diagnoses:  Fall, initial encounter  Sprain of right wrist, unspecified location, initial encounter    ED Discharge Orders     None  Shermon Warren SAILOR, PA-C 10/07/24 1803    Simon Lavonia SAILOR, MD 10/07/24 [phone]

## 2024-10-07 NOTE — ED Triage Notes (Signed)
 Pt comes in from a fall at work. Pt is having rt wrist pain, rt knee pain, and rt clavicle pain.

## 2024-10-07 NOTE — Discharge Instructions (Signed)
 I would recommend using wrist brace during the day.  You can take 600 mg of ibuprofen  with food every 8 hours.  You can also take 1000 mg of Tylenol  every 8 hours.  Use ice or heat over area of pain.  You can follow-up with Dr. Sharl orthopedics for recheck of symptoms and return to emergency room with any new or worsening symptoms.

## 2024-10-07 NOTE — ED Provider Triage Note (Signed)
 Emergency Medicine Provider Triage Evaluation Note  Krystal Johnson , a 68 y.o. female  was evaluated in triage.  Pt complains of chemical fall over a box landing on outstretched arm and now having right knee pain, right wrist pain right shoulder pain requesting ibuprofen .  Review of Systems  Positive: Wrist pain, should pain, knee pain Negative: HA  Physical Exam  BP 125/72   Pulse 67   Temp 98.1 F (36.7 C)   Resp 16   Ht 5' 4 (1.626 m)   Wt 86.2 kg   SpO2 98%   BMI 32.61 kg/m  Gen:   Awake, no distress   Resp:  Normal effort  MSK:   Moves extremities without difficulty  Other:    Medical Decision Making  Medically screening exam initiated at 3:02 PM.  Appropriate orders placed.  Krystal Johnson was informed that the remainder of the evaluation will be completed by another provider, this initial triage assessment does not replace that evaluation, and the importance of remaining in the ED until their evaluation is complete.     Krystal Warren SAILOR, PA-C 10/07/24 [id number]
